# Patient Record
Sex: Male | Born: 1980 | Race: White | Hispanic: No | Marital: Married | Smoking: Never smoker
Health system: Southern US, Community
[De-identification: ages and names within clinical notes are randomized; demographics above are authoritative.]

## PROBLEM LIST (undated history)

## (undated) DIAGNOSIS — G43909 Migraine, unspecified, not intractable, without status migrainosus: Secondary | ICD-10-CM

## (undated) DIAGNOSIS — R55 Syncope and collapse: Secondary | ICD-10-CM

## (undated) DIAGNOSIS — R9430 Abnormal result of cardiovascular function study, unspecified: Secondary | ICD-10-CM

## (undated) DIAGNOSIS — R011 Cardiac murmur, unspecified: Secondary | ICD-10-CM

## (undated) DIAGNOSIS — S42302A Unspecified fracture of shaft of humerus, left arm, initial encounter for closed fracture: Secondary | ICD-10-CM

## (undated) DIAGNOSIS — R002 Palpitations: Secondary | ICD-10-CM

## (undated) HISTORY — DX: Syncope and collapse: R55

## (undated) HISTORY — DX: Cardiac murmur, unspecified: R01.1

## (undated) HISTORY — DX: Migraine, unspecified, not intractable, without status migrainosus: G43.909

## (undated) HISTORY — DX: Palpitations: R00.2

## (undated) HISTORY — DX: Abnormal result of cardiovascular function study, unspecified: R94.30

## (undated) HISTORY — PX: WISDOM TOOTH EXTRACTION: SHX21

---

## 2012-07-03 ENCOUNTER — Encounter (HOSPITAL_COMMUNITY): Payer: Self-pay | Admitting: Emergency Medicine

## 2012-07-03 ENCOUNTER — Emergency Department (HOSPITAL_COMMUNITY): Payer: BC Managed Care – PPO

## 2012-07-03 ENCOUNTER — Emergency Department (HOSPITAL_COMMUNITY)
Admission: EM | Admit: 2012-07-03 | Discharge: 2012-07-03 | Disposition: A | Discharge status: Home / Self Care | Payer: BC Managed Care – PPO | Attending: Emergency Medicine | Admitting: Emergency Medicine

## 2012-07-03 DIAGNOSIS — R55 Syncope and collapse: Secondary | ICD-10-CM

## 2012-07-03 DIAGNOSIS — S0181XA Laceration without foreign body of other part of head, initial encounter: Secondary | ICD-10-CM

## 2012-07-03 DIAGNOSIS — S0180XA Unspecified open wound of other part of head, initial encounter: Secondary | ICD-10-CM | POA: Insufficient documentation

## 2012-07-03 DIAGNOSIS — S0990XA Unspecified injury of head, initial encounter: Secondary | ICD-10-CM | POA: Insufficient documentation

## 2012-07-03 DIAGNOSIS — Y9389 Activity, other specified: Secondary | ICD-10-CM | POA: Insufficient documentation

## 2012-07-03 DIAGNOSIS — Y9289 Other specified places as the place of occurrence of the external cause: Secondary | ICD-10-CM | POA: Insufficient documentation

## 2012-07-03 DIAGNOSIS — W1809XA Striking against other object with subsequent fall, initial encounter: Secondary | ICD-10-CM | POA: Insufficient documentation

## 2012-07-03 HISTORY — DX: Unspecified fracture of shaft of humerus, left arm, initial encounter for closed fracture: S42.302A

## 2012-07-03 LAB — COMPREHENSIVE METABOLIC PANEL
Albumin: 4.3 g/dL (ref 3.5–5.2)
Alkaline Phosphatase: 53 U/L (ref 39–117)
BUN: 18 mg/dL (ref 6–23)
CO2: 26 mEq/L (ref 19–32)
Chloride: 104 mEq/L (ref 96–112)
Creatinine, Ser: 0.94 mg/dL (ref 0.50–1.35)
GFR calc Af Amer: >90 mL/min (ref >90)
GFR calc non Af Amer: >90 mL/min (ref >90)
Glucose, Bld: 90 mg/dL (ref 70–99)
Potassium: 4.2 mEq/L (ref 3.5–5.1)
Total Bilirubin: 0.8 mg/dL (ref 0.3–1.2)

## 2012-07-03 LAB — CBC WITH DIFFERENTIAL/PLATELET
HCT: 45.7 % (ref 39.0–52.0)
Hemoglobin: 16 g/dL (ref 13.0–17.0)
Lymphocytes Relative: 20 % (ref 12–46)
Lymphs Abs: 1.3 10*3/uL (ref 0.7–4.0)
MCHC: 35 g/dL (ref 30.0–36.0)
Monocytes Absolute: 0.5 10*3/uL (ref 0.1–1.0)
Monocytes Relative: 8 % (ref 3–12)
Neutro Abs: 4.6 10*3/uL (ref 1.7–7.7)
Neutrophils Relative %: 72 % (ref 43–77)
RBC: 5.2 MIL/uL (ref 4.22–5.81)

## 2012-07-03 MED ORDER — HYDROCODONE-ACETAMINOPHEN 5-325 MG PO TABS: 1.0000 | ORAL_TABLET | Freq: Four times a day (QID) | ORAL | Status: DC | PRN

## 2012-07-03 MED ORDER — SODIUM CHLORIDE 0.9 % IV BOLUS (SEPSIS): 1000.0000 mL | Freq: Once | INTRAVENOUS | Status: DC

## 2012-07-03 MED ORDER — OXYCODONE-ACETAMINOPHEN 5-325 MG PO TABS
1.0000 | ORAL_TABLET | Freq: Once | ORAL | Status: AC
  Administered 2012-07-03: 1 via ORAL

## 2012-07-03 MED ORDER — OXYCODONE-ACETAMINOPHEN 5-325 MG PO TABS
2.0000 | ORAL_TABLET | Freq: Once | ORAL | Status: DC
  Filled 2012-07-03: qty 2

## 2012-07-03 MED ORDER — IBUPROFEN 800 MG PO TABS: 800.0000 mg | ORAL_TABLET | Freq: Three times a day (TID) | ORAL | Status: DC

## 2012-07-03 NOTE — ED Notes (Signed)
Med student at bedside for suture placement

## 2012-07-03 NOTE — ED Provider Notes (Signed)
Medical screening examination/treatment/procedure(s) were conducted as a shared visit with non-physician practitioner(s) and myself.  I personally evaluated the patient during the encounter   Shelda Jakes, MD 07/03/12 [phone]

## 2012-07-03 NOTE — ED Notes (Signed)
Patient reports that he was taking a shower this morning and had a syncopal episode with +LOC.  Patient has approximately 1 in. laceration on chin with dressing.  Patient denies any h/o or associated symptoms.

## 2012-07-03 NOTE — ED Notes (Signed)
Patient transported to X-ray 

## 2012-07-03 NOTE — ED Provider Notes (Signed)
History     CSN: [id number]  Arrival date & time 07/03/12  4098   First MD Initiated Contact with Patient 07/03/12 0930      Chief Complaint  Patient presents with  . Loss of Consciousness    (Consider location/radiation/quality/duration/timing/severity/associated sxs/prior treatment) The history is provided by the patient.   32 year old male status post syncopal episode in the shower fell and struck his chin left-sided the 23 cm chin laceration. Patient's last tetanus shot was sometime in within the last 10 years. Patient felt lightheaded right before he went out otherwise felt fine. Now feels back to normal when he first woke up he had some tingling and numbness in both arms. No history of syncope in the past. Patient did chip a tooth yesterday and has had a strange sensation related to that this may of contributed. Currently complaining of some mild head discomfort may be related to the injury there was no headache before hand. Some mild neck pain. No other specific or significant complaints.  Past Medical History  Diagnosis Date  . Arm fracture, left     Past Surgical History  Procedure Date  . Wisdom tooth extraction     History reviewed. No pertinent family history.  History  Substance Use Topics  . Smoking status: Never Smoker   . Smokeless tobacco: Not on file  . Alcohol Use: Yes     Comment: socially      Review of Systems  Constitutional: Negative for fever and fatigue.  HENT: Positive for neck pain. Negative for congestion.   Eyes: Negative for visual disturbance.  Respiratory: Negative for shortness of breath.   Cardiovascular: Negative for chest pain.  Gastrointestinal: Negative for nausea, vomiting and abdominal pain.  Genitourinary: Negative for dysuria and hematuria.  Musculoskeletal: Negative for back pain.  Skin: Positive for wound. Negative for rash.  Neurological: Positive for numbness and headaches.  Hematological: Does not bruise/bleed easily.      Allergies  Dairy aid  Home Medications   Current Outpatient Rx  Name  Route  Sig  Dispense  Refill  . HYDROCODONE-ACETAMINOPHEN 5-325 MG PO TABS   Oral   Take 1-2 tablets by mouth every 6 (six) hours as needed for pain.   10 tablet   0   . IBUPROFEN 800 MG PO TABS   Oral   Take 1 tablet (800 mg total) by mouth 3 (three) times daily.   21 tablet   0     BP 111/70  Pulse 84  Temp 97.5 F (36.4 C) (Oral)  Resp 16  SpO2 100%  Physical Exam  Nursing note and vitals reviewed. Constitutional: He is oriented to person, place, and time. He appears well-developed and well-nourished. No distress.  HENT:  Head: Normocephalic.  Mouth/Throat: Oropharynx is clear and moist.       Normal except for left shin 2-3 cm laceration. Bleeding controlled.  Eyes: Conjunctivae normal and EOM are normal. Pupils are equal, round, and reactive to light.  Neck: Normal range of motion. Neck supple.  Cardiovascular: Normal rate, normal heart sounds and intact distal pulses.   No murmur heard. Pulmonary/Chest: Effort normal and breath sounds normal. No respiratory distress.  Abdominal: Soft. Bowel sounds are normal. There is no tenderness.  Musculoskeletal: Normal range of motion. He exhibits no edema and no tenderness.  Neurological: He is alert and oriented to person, place, and time. No cranial nerve deficit. He exhibits normal muscle tone. Coordination normal.  Skin: Skin is warm. No rash  noted.    ED Course  Procedures (including critical care time)   Labs Reviewed  CBC WITH DIFFERENTIAL  COMPREHENSIVE METABOLIC PANEL   Dg Chest 2 View  07/03/2012  *RADIOLOGY REPORT*  Clinical Data: Loss of consciousness  CHEST - 2 VIEW  Comparison: None.  Findings: Lungs are mildly hyperexpanded but clear.  Heart size and pulmonary vascularity are normal.  No adenopathy.  No bone lesions. No pneumothorax.  IMPRESSION: Lungs mildly hyperexpanded but clear.   Original Report Authenticated By: Bretta Bang, M.D.    Ct Head Wo Contrast  07/03/2012  *RADIOLOGY REPORT*  Clinical Data: Loss of consciousness with trauma; bilateral hand numbness  CT HEAD WITHOUT CONTRAST  Technique:  Contiguous axial images were obtained from the base of the skull through the vertex without contrast.  Comparison: None.  Findings:  Ventricles are normal in size and configuration.  There is no mass, hemorrhage, extra-axial fluid collection, or midline shift.  Gray-white compartments are normal.  Bony calvarium appears intact.  The mastoid air cells are clear.  IMPRESSION: Study within normal limits.   Original Report Authenticated By: Bretta Bang, M.D.    Ct Cervical Spine Wo Contrast  07/03/2012  *RADIOLOGY REPORT*  Clinical Data: Trauma with loss of consciousness  CT CERVICAL SPINE WITHOUT CONTRAST  Technique:  Multidetector CT imaging of the cervical spine was performed. Multiplanar CT image reconstructions were also generated.  Comparison: None.  Findings:  There is no fracture or spondylolisthesis.  Prevertebral soft tissues and predental space regions are normal.  Disc spaces appear intact.  There is no appreciable disc extrusion or stenosis.  No extradural defects are appreciated on this study.  Impression:  No appreciable arthropathy.  No fracture or spondylolisthesis.   Original Report Authenticated By: Bretta Bang, M.D.     Date: 07/03/2012  Rate: 64  Rhythm: normal sinus rhythm  QRS Axis: normal  Intervals: normal  ST/T Wave abnormalities: normal  Conduction Disutrbances:none  Narrative Interpretation:   Old EKG Reviewed: none available    1. Syncope, vasovagal   2. Chin laceration       MDM  Patient with single episode in the shower felt lightheaded right before and was not feeling bad earlier or not feeling bad last evening. Patient did chip a tooth yesterday and has had some sort of strange sensation from the tooth that may have contributed. Patient now feels fine. When he fell in  the shower he did sustain a left lower Chin laceration about 2 cm in size. Workup for the syncope was negative cardiac monitoring without arrhythmia head CT negative neck CT negative labs are negative. EKG was normal. Patient had orthostatic blood pressures done in the emergency apartment which were normal. Patient is nontoxic no acute distress can be discharged home sutures can be removed in 5-7 days. Patient will return for any recurrent symptoms. Patient did not require work note he is able to stay at home today and take it easy.  Medical screening examination/treatment/procedure(s) were conducted as a shared visit with non-physician practitioner(s) and myself.  I personally evaluated the patient during the encounter    Suture repair done by mid-level this will be a shared visit provider on procedure note.          Shelda Jakes, MD 07/03/12 [phone]

## 2012-07-03 NOTE — ED Notes (Addendum)
Patient reports syncope with taking a shower, arm numbness, shaking, and paleness.  Patient now reporting lethargy and "feeling groggy".

## 2012-07-03 NOTE — ED Provider Notes (Signed)
LACERATION REPAIR Performed by: Carolee Rota Authorized by: Carolee Rota Consent: Verbal consent obtained. Risks and benefits: risks, benefits and alternatives were discussed Consent given by: patient Patient identity confirmed: provided demographic data Prepped and Draped in normal sterile fashion Wound explored  Laceration Location: L chin  Laceration Length: 2cm  No Foreign Bodies seen or palpated  Anesthesia: local infiltration  Local anesthetic: lidocaine 2% without epinephrine  Anesthetic total: 3 ml  Irrigation method: skin scrub with dermal clenser Amount of cleaning: standard  Skin closure: 5-0 Prolene  Number of sutures: 5  Technique: simple interrupted  Patient tolerance: Patient tolerated the procedure well with no immediate complications.   Christopher Cordova, [place] 07/03/12 1158

## 2013-01-24 ENCOUNTER — Encounter: Payer: Self-pay | Admitting: Cardiology

## 2013-01-24 ENCOUNTER — Ambulatory Visit (INDEPENDENT_AMBULATORY_CARE_PROVIDER_SITE_OTHER): Payer: BC Managed Care – PPO | Admitting: Cardiology

## 2013-01-24 VITALS — BP 106/76 | HR 81 | Ht 72.5 in | Wt 143.8 lb

## 2013-01-24 DIAGNOSIS — R011 Cardiac murmur, unspecified: Secondary | ICD-10-CM | POA: Insufficient documentation

## 2013-01-24 DIAGNOSIS — G43909 Migraine, unspecified, not intractable, without status migrainosus: Secondary | ICD-10-CM | POA: Insufficient documentation

## 2013-01-24 DIAGNOSIS — R0789 Other chest pain: Secondary | ICD-10-CM

## 2013-01-24 DIAGNOSIS — R002 Palpitations: Secondary | ICD-10-CM | POA: Insufficient documentation

## 2013-01-24 DIAGNOSIS — R55 Syncope and collapse: Secondary | ICD-10-CM

## 2013-01-24 NOTE — Assessment & Plan Note (Signed)
At this point there is no evidence of ischemic disease. I think that his chest tightness is nonspecific. At this point I am not recommending any exercise testing. I've encouraged him to go back usual activities.

## 2013-01-24 NOTE — Patient Instructions (Addendum)
**Note De-Identified Latish Toutant Obfuscation** Your physician has requested that you have an echocardiogram. Echocardiography is a painless test that uses sound waves to create images of your heart. It provides your doctor with information about the size and shape of your heart and how well your heart's chambers and valves are working. This procedure takes approximately one hour. There are no restrictions for this procedure. Please schedule within a week from today.  Your physician has recommended that you wear an event monitor. Event monitors are medical devices that record the heart's electrical activity. Doctors most often [place] these monitors to diagnose arrhythmias. Arrhythmias are problems with the speed or rhythm of the heartbeat. The monitor is a small, portable device. You can wear one while you do your normal daily activities. This is usually used to diagnose what is causing palpitations/syncope (passing out). You will wear for 21 days.  Your physician recommends that you schedule a follow-up appointment in: 3 weeks

## 2013-01-24 NOTE — Assessment & Plan Note (Signed)
At this point the patient's syncope sounds like vasovagal syncope. However I do feel that complete evaluation is appropriate. If he has mitral valve prolapse he did have some increased chance of significant arrhythmias. His EKG questions atrial abnormality. Two-dimensional echo be done to assess further. In addition he will wear a 21 day event recorder to be sure that we are not missing any arrhythmias.

## 2013-01-24 NOTE — Progress Notes (Signed)
   HPI  Patient is seen as an add-on today for the complete evaluation of syncope. 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 He has had 2 episodes of syncope. In the past he had an episode in the shower. He had very slight dizziness and then found himself on the floor with a injury to his chin. This required an emergency room visit. The workup revealed no obvious abnormalities. It was felt that he probably had vasovagal syncope. Recently he and his family had a significant GI illness. He had vomiting. With the vomiting he had a syncopal episode. Since that time he said some chest discomfort at rest. He's here for further evaluation.  There is no family history of syncope. There is no family history of sudden cardiac death.  Allergies  Allergen Reactions  . Dairy Aid [Lactase]     Current Outpatient Prescriptions  Medication Sig Dispense Refill  . ibuprofen (ADVIL,MOTRIN) 800 MG tablet Take 800 mg by mouth as needed.       No current facility-administered medications for this visit.    History   Social History  . Marital Status: Married    Spouse Name: N/A    Number of Children: N/A  . Years of Education: N/A   Occupational History  . Not on file.   Social History Main Topics  . Smoking status: Never Smoker   . Smokeless tobacco: Not on file  . Alcohol Use: Yes     Comment: socially  . Drug Use: No  . Sexual Activity: Not Currently   Other Topics Concern  . Not on file   Social History Narrative  . No narrative on file    History reviewed. No pertinent family history.  Past Medical History  Diagnosis Date  . Arm fracture, left   . Syncope   . Palpitation   . Migraines     Past Surgical History  Procedure Laterality Date  . Wisdom tooth extraction      Patient Active Problem List   Diagnosis Date Noted  . Chest tightness 01/24/2013  . Syncope   . Palpitation   . Migraines     ROS   Patient denies fever, chills, headache, sweats,  rash, change in vision, change in hearing, cough, nausea vomiting, urinary symptoms. All other systems are reviewed and are negative.  PHYSICAL EXAM  Patient is here with his wife and his infant child. His wife is a Public affairs consultant. And works at some of the hospitals in town. He is oriented to person time and place. Affect is normal. There is no jugulovenous distention. Lungs are clear. Respiratory effort is nonlabored. Cardiac exam reveals S1 and S2. There is a mitral midsystolic click. There is no murmur. The abdomen is soft. There is no peripheral edema. There are no musculoskeletal deformities. There are no skin rashes.  Filed Vitals:   01/24/13 1603  BP: 106/76  Pulse: 81  Height: 6' 0.5" (1.842 m)  Weight: 143 lb 12.8 oz (65.227 kg)   I have reviewed the outside EKG dated January 23, 2013. There is incomplete right bundle branch block very at there is some very mild nonspecific ST changes. The QT interval is not prolonged. I've also reviewed the EKG from today. There is question of atrial enlargement. ASSESSMENT & PLAN

## 2013-01-24 NOTE — Assessment & Plan Note (Signed)
On physical examination today there is a mitral click. I do not hear murmur. Two-dimensional echo will be done to assess further. In addition there is question of a right atrial abnormality and possibly left atrial abnormality on EKG. This will be further assessed by echo.

## 2013-01-30 ENCOUNTER — Encounter: Payer: Self-pay | Admitting: *Deleted

## 2013-01-30 ENCOUNTER — Ambulatory Visit (HOSPITAL_COMMUNITY): Payer: BC Managed Care – PPO | Attending: Cardiology

## 2013-01-30 ENCOUNTER — Encounter (INDEPENDENT_AMBULATORY_CARE_PROVIDER_SITE_OTHER): Payer: BC Managed Care – PPO

## 2013-01-30 DIAGNOSIS — R011 Cardiac murmur, unspecified: Secondary | ICD-10-CM

## 2013-01-30 DIAGNOSIS — R42 Dizziness and giddiness: Secondary | ICD-10-CM | POA: Insufficient documentation

## 2013-01-30 DIAGNOSIS — R0789 Other chest pain: Secondary | ICD-10-CM | POA: Insufficient documentation

## 2013-01-30 DIAGNOSIS — R55 Syncope and collapse: Secondary | ICD-10-CM

## 2013-01-30 DIAGNOSIS — R002 Palpitations: Secondary | ICD-10-CM | POA: Insufficient documentation

## 2013-01-30 DIAGNOSIS — R9431 Abnormal electrocardiogram [ECG] [EKG]: Secondary | ICD-10-CM | POA: Insufficient documentation

## 2013-01-30 NOTE — Progress Notes (Signed)
Echocardiogram performed.  

## 2013-01-30 NOTE — Progress Notes (Signed)
Patient ID: Christopher Cordova, male   DOB: 03-06-1981, 32 y.o.   MRN: [id number] E-Cardio verite 21 day cardiac event monitor applied to patient.

## 2013-01-31 ENCOUNTER — Encounter: Payer: Self-pay | Admitting: Cardiology

## 2013-01-31 DIAGNOSIS — IMO0002 Reserved for concepts with insufficient information to code with codable children: Secondary | ICD-10-CM | POA: Insufficient documentation

## 2013-01-31 DIAGNOSIS — R9430 Abnormal result of cardiovascular function study, unspecified: Secondary | ICD-10-CM | POA: Insufficient documentation

## 2013-02-18 ENCOUNTER — Ambulatory Visit (INDEPENDENT_AMBULATORY_CARE_PROVIDER_SITE_OTHER): Payer: BC Managed Care – PPO | Admitting: Cardiology

## 2013-02-18 ENCOUNTER — Encounter: Payer: Self-pay | Admitting: Cardiology

## 2013-02-18 VITALS — BP 116/68 | HR 80 | Ht 72.5 in | Wt 148.0 lb

## 2013-02-18 DIAGNOSIS — R9430 Abnormal result of cardiovascular function study, unspecified: Secondary | ICD-10-CM

## 2013-02-18 DIAGNOSIS — R011 Cardiac murmur, unspecified: Secondary | ICD-10-CM

## 2013-02-18 DIAGNOSIS — R0789 Other chest pain: Secondary | ICD-10-CM

## 2013-02-18 DIAGNOSIS — R002 Palpitations: Secondary | ICD-10-CM

## 2013-02-18 DIAGNOSIS — R0989 Other specified symptoms and signs involving the circulatory and respiratory systems: Secondary | ICD-10-CM

## 2013-02-18 DIAGNOSIS — R55 Syncope and collapse: Secondary | ICD-10-CM

## 2013-02-18 NOTE — Patient Instructions (Addendum)
Your physician wants you to follow-up in: 6 months  You will receive a reminder letter in the mail two months in advance. If you don't receive a letter, please call our office to schedule the follow-up appointment.  Your physician recommends that you continue on your current medications as directed. Please refer to the Current Medication list given to you today.  

## 2013-02-18 NOTE — Assessment & Plan Note (Signed)
The patient is not having any significant arrhythmias. His event recorder reveals no significant arrhythmias. No further workup is needed.

## 2013-02-18 NOTE — Assessment & Plan Note (Signed)
His chest tightness is insignificant. He does not need any further workup. He will go back to full activities. I will plan to follow him up on one occasion in 6 months.

## 2013-02-18 NOTE — Assessment & Plan Note (Signed)
The patient definitely has a mitral click on physical exam. There is no murmur. The echo shows slight bowing of the valve. There is no definite prolapse. There is no mitral regurgitation. No further workup is needed. Followup echo in 2-3 years would be appropriate to follow his valve.

## 2013-02-18 NOTE — Progress Notes (Signed)
   HPI  The patient is seen today for followup evaluation of syncope, mild sinus tachycardia, slight chest tightness. I saw him last January 24, 2013. Over time he had had 2 syncopal episodes that sounded vasovagal to me. I decided to proceed with 2-D echo. He has excellent left ventricular function. There is flat closure the mitral valve but no definite prolapse. There was no significant mitral regurgitation. He's been wearing an event recorder. It has shown only sinus rhythm.  TSH was normal at 0.7.  Since his last visit he has not had any significant presyncopal or syncopal symptoms. Also his heart rate has been normal. He had some mild increase in heart rate before I saw him. This was probably related to a viral illness that he was having around that time.  Allergies  Allergen Reactions  . Dairy Aid [Lactase]     Current Outpatient Prescriptions  Medication Sig Dispense Refill  . ibuprofen (ADVIL,MOTRIN) 800 MG tablet Take 800 mg by mouth as needed.       No current facility-administered medications for this visit.    History   Social History  . Marital Status: Married    Spouse Name: N/A    Number of Children: N/A  . Years of Education: N/A   Occupational History  . Not on file.   Social History Main Topics  . Smoking status: Never Smoker   . Smokeless tobacco: Not on file  . Alcohol Use: Yes     Comment: socially  . Drug Use: No  . Sexual Activity: Not Currently   Other Topics Concern  . Not on file   Social History Narrative  . No narrative on file    History reviewed. No pertinent family history.  Past Medical History  Diagnosis Date  . Arm fracture, left   . Syncope   . Palpitation   . Migraines   . Mitral click   . Ejection fraction     .    Past Surgical History  Procedure Laterality Date  . Wisdom tooth extraction      Patient Active Problem List   Diagnosis Date Noted  . Ejection fraction   . Chest tightness 01/24/2013  . Syncope   .  Palpitation   . Migraines   . Mitral click     ROS   Patient denies fever, chills, headache, sweats, rash, change in vision, change in hearing, cough, nausea vomiting, urinary symptoms. All other systems are reviewed and are negative.  PHYSICAL EXAM  Patient is oriented to person time and place. Affect is normal. There is no jugular venous distention. Lungs are clear. Respiratory effort is not labored. Cardiac exam reveals S1 and S2. There is a mitral click heard. There is no murmur. The abdomen is soft. Is no peripheral edema.     Filed Vitals:   02/18/13 1616  BP: 116/68  Pulse: 80  Height: 6' 0.5" (1.842 m)  Weight: 148 lb (67.132 kg)  SpO2: 99%   EKG is done today. I repeated it specifically because the question of very slight abnormalities on the prior EKG. The EKG today is completely normal.  ASSESSMENT & PLAN

## 2013-02-18 NOTE — Assessment & Plan Note (Signed)
Echo shows that his ejection fraction is completely normal.

## 2013-02-18 NOTE — Assessment & Plan Note (Signed)
I believe his syncope was vasovagal. He's been very careful to be sure that he remains hydrated. If he feels poorly he knows to lie down. He will contact me if he has any further problems.

## 2014-08-28 IMAGING — CR DG CHEST 2V
2 series · 2 of 2 positions shown · non-contrast
Comparison: None.

CLINICAL DATA: Loss of consciousness

CHEST - 2 VIEW

[w chest pa]
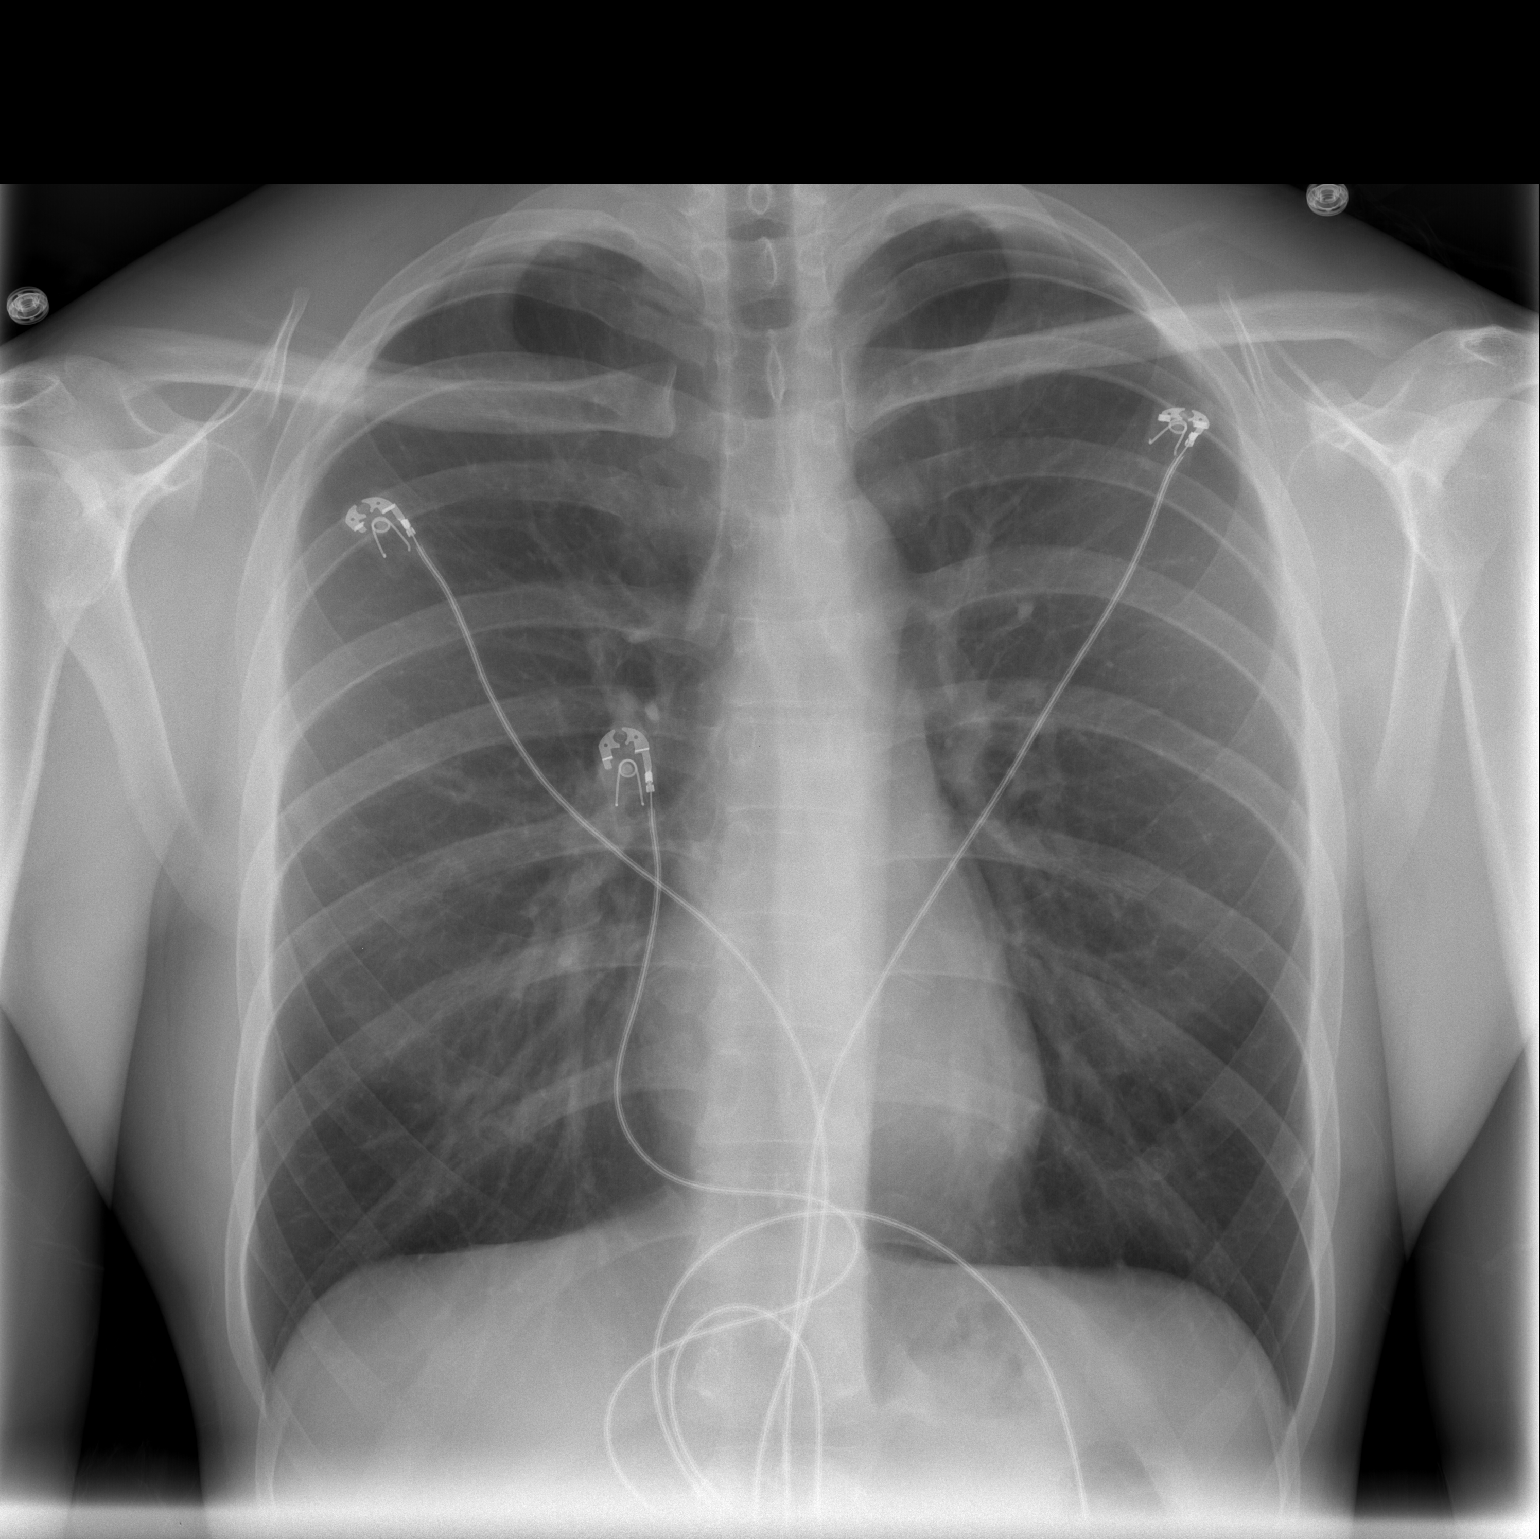

[w chest lat]
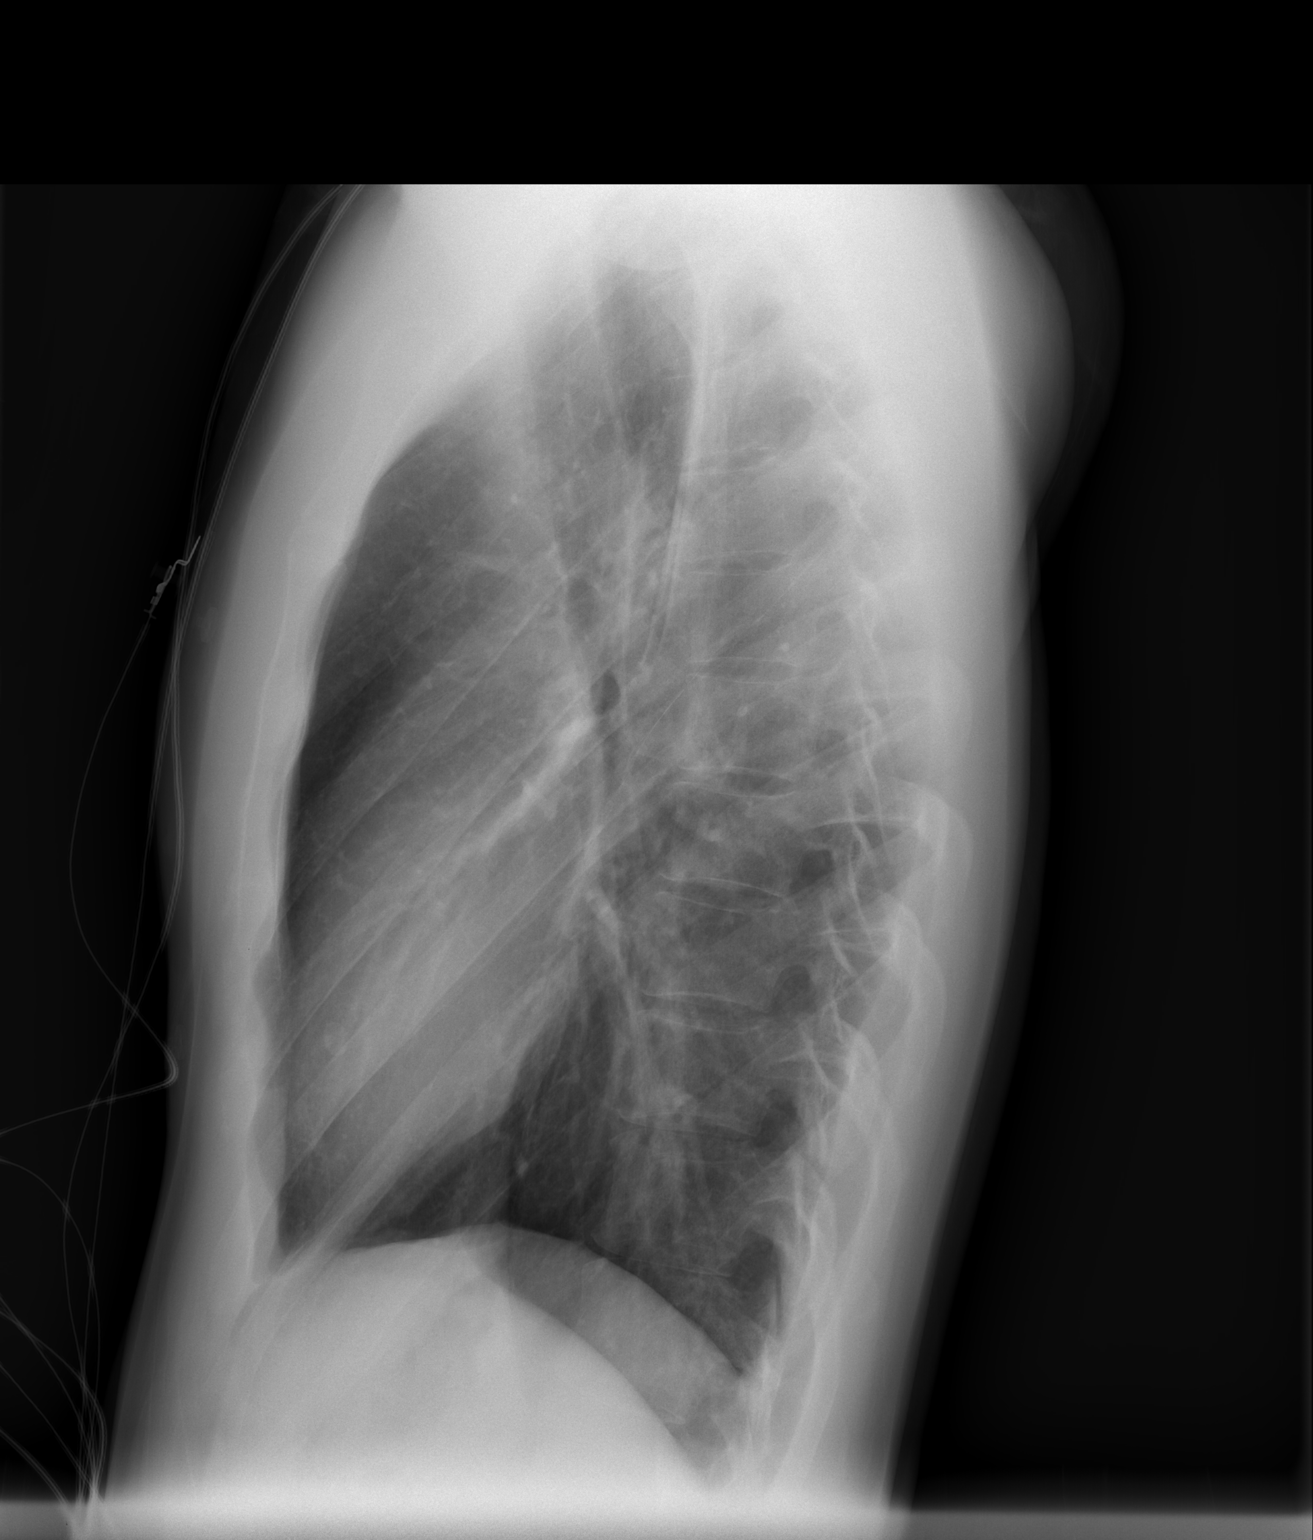

[2 of 2 positions shown; findings below may reference images not displayed]

FINDINGS: Lungs are mildly hyperexpanded but clear.  Heart size and
pulmonary vascularity are normal.  No adenopathy.  No bone lesions.
No pneumothorax.
IMPRESSION: Lungs mildly hyperexpanded but clear.

## 2014-08-28 IMAGING — CT CT HEAD W/O CM
4 of 6 series · 17 of 47 positions shown, 20 images · non-contrast
Comparison: None.

CLINICAL DATA: Loss of consciousness with trauma; bilateral hand
numbness

CT HEAD WITHOUT CONTRAST
TECHNIQUE: Contiguous axial images were obtained from the base of
the skull through the vertex without contrast.

[Series 4: bone windows · axial · 0.43mm/px · z∈[-103,-67]mm · 2 of 49 slices shown]
[im 13/49  bone]
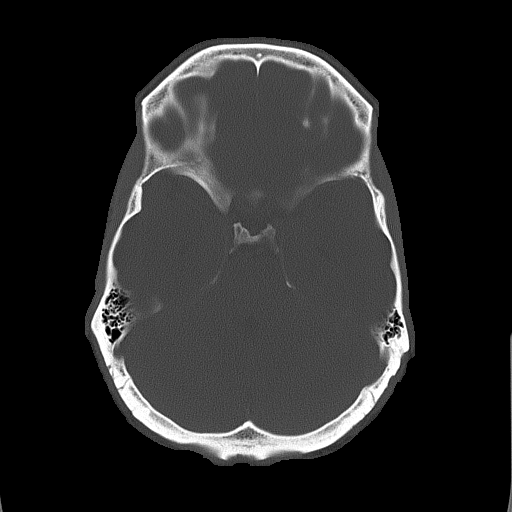
[im 25/49  bone]
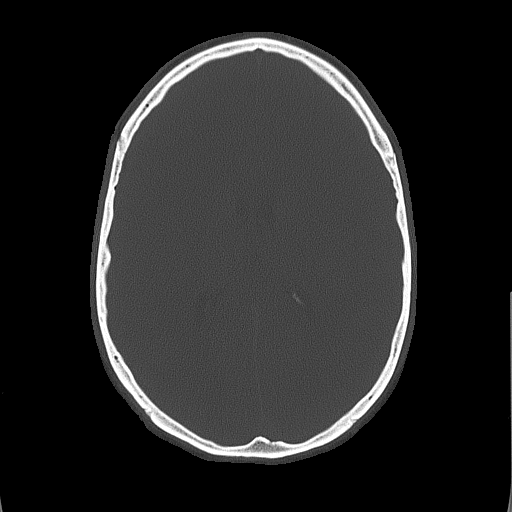

[Series 602: <mpr thick range> · axial · 0.37mm/px · z∈[-295,-145]mm · 9 of 96 slices shown, 12 images]
[im 10/96  brain]
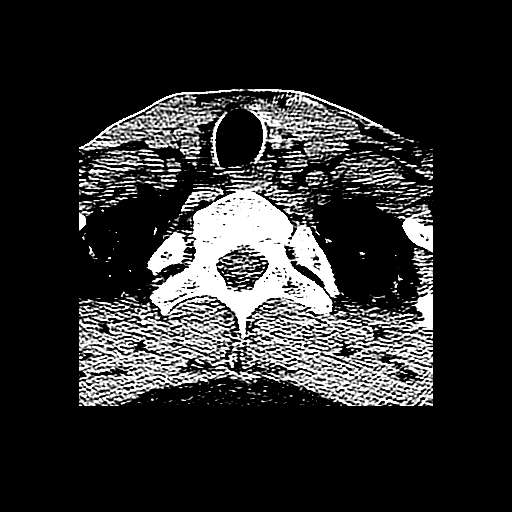
[im 10/96  bone]
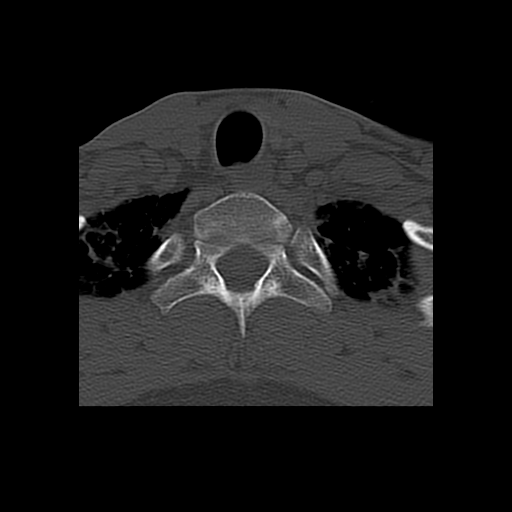
[im 20/96  brain]
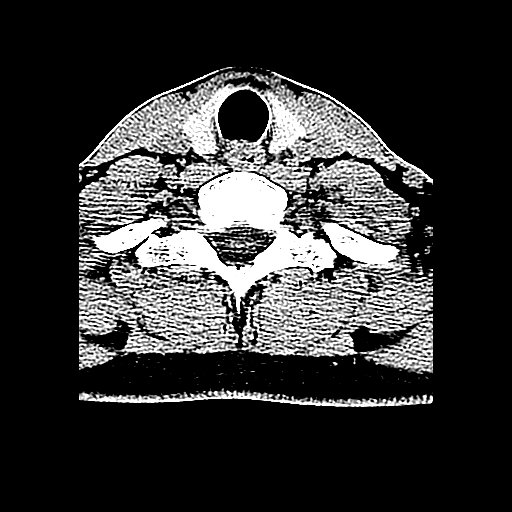
[im 29/96  brain]
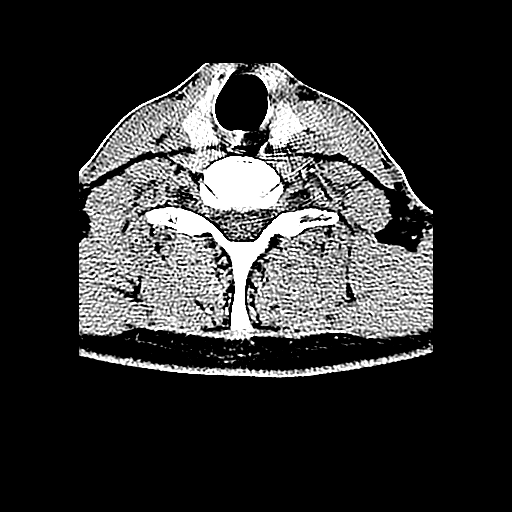
[im 39/96  brain]
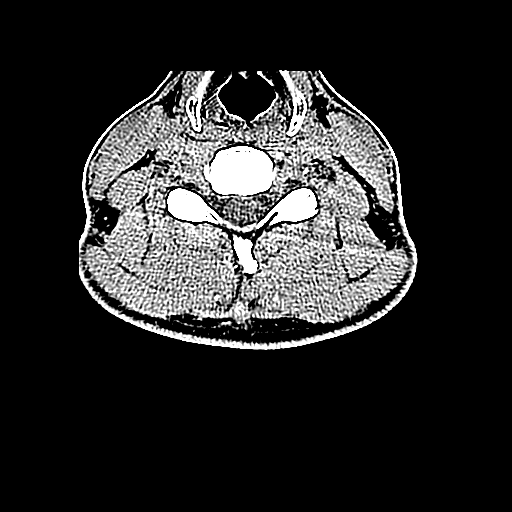
[im 48/96  brain]
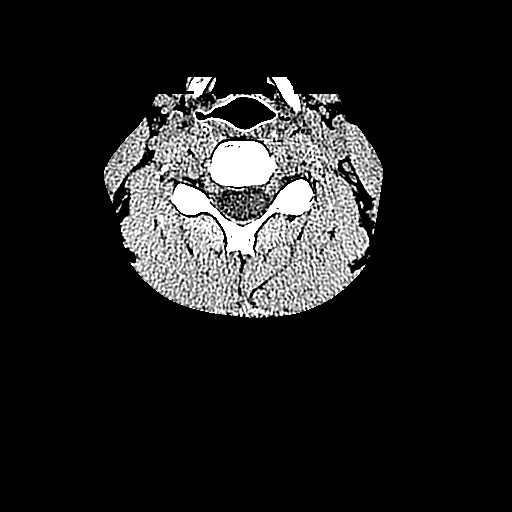
[im 48/96  bone]
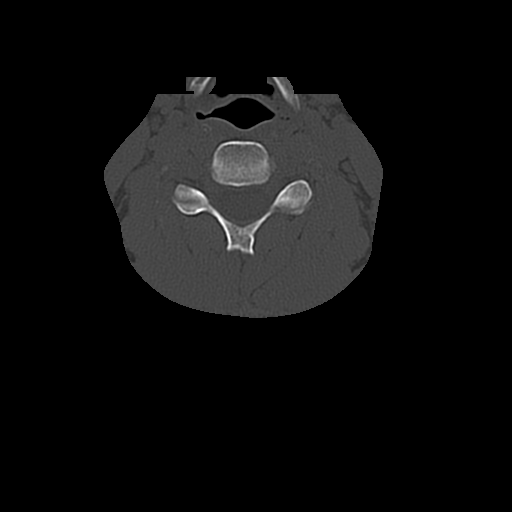
[im 58/96  brain]
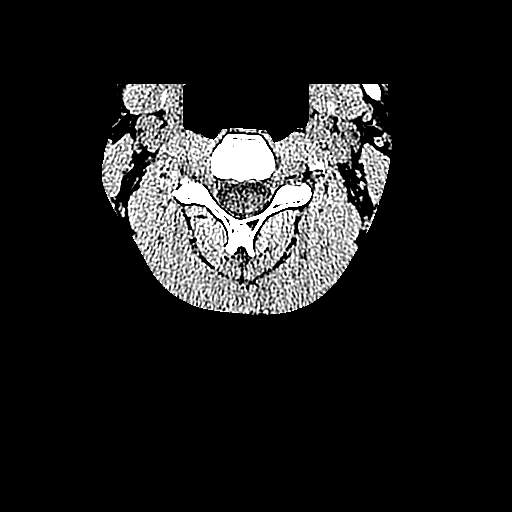
[im 67/96  brain]
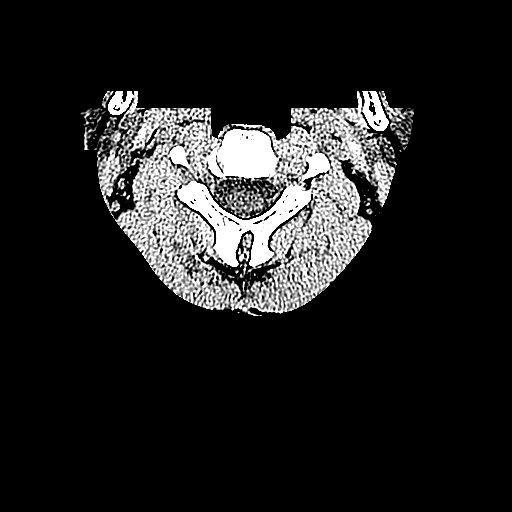
[im 77/96  brain]
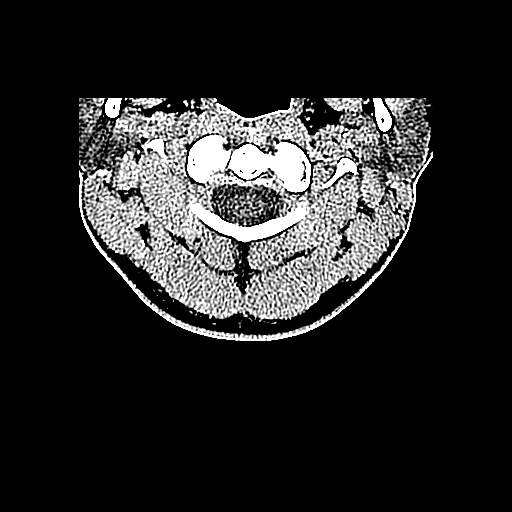
[im 86/96  brain]
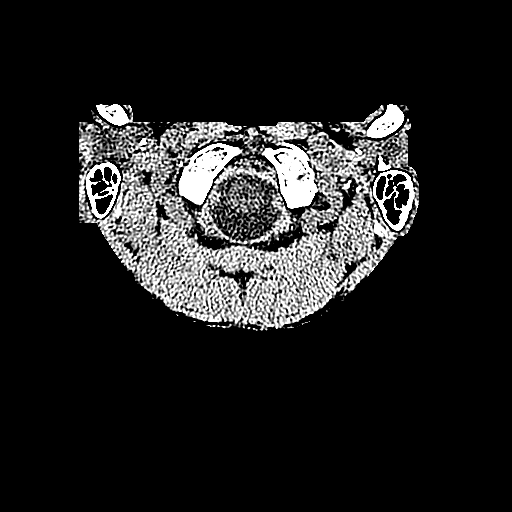
[im 86/96  bone]
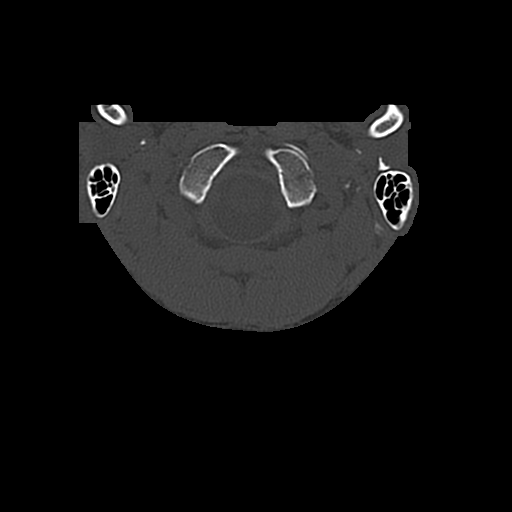

[Series 603: <mpr thick range(1)> · coronal · 0.37mm/px · 3 of 48 slices shown]
[im 16/48  brain]
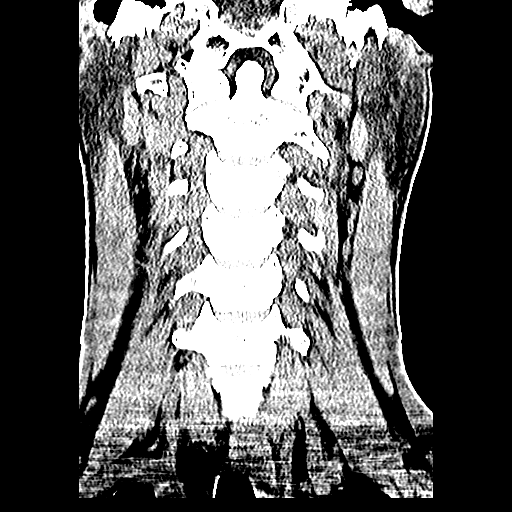
[im 21/48  brain]
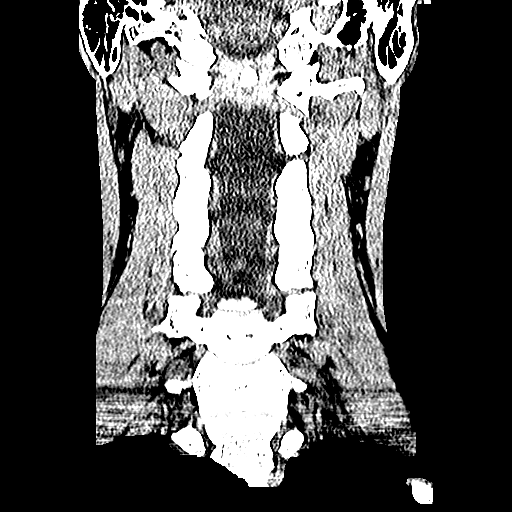
[im 27/48  brain]
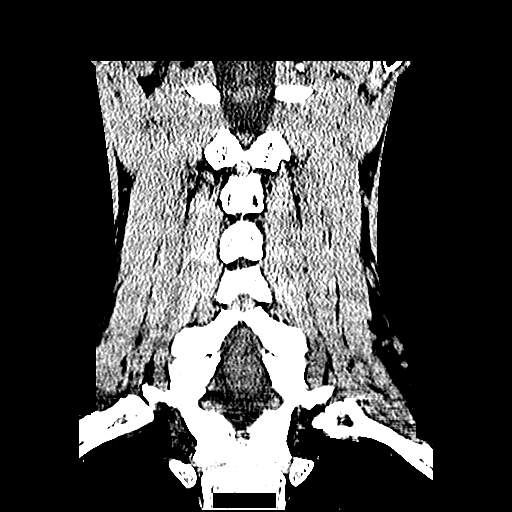

[Series 604: <mpr thick range(2)> · sagittal · 0.37mm/px · 3 of 51 slices shown]
[im 17/51  brain]
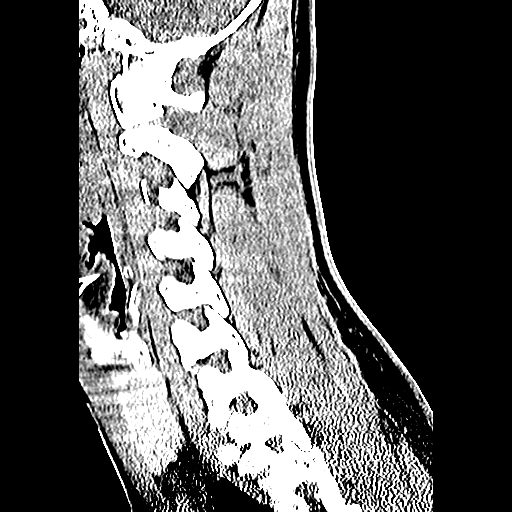
[im 26/51  brain]
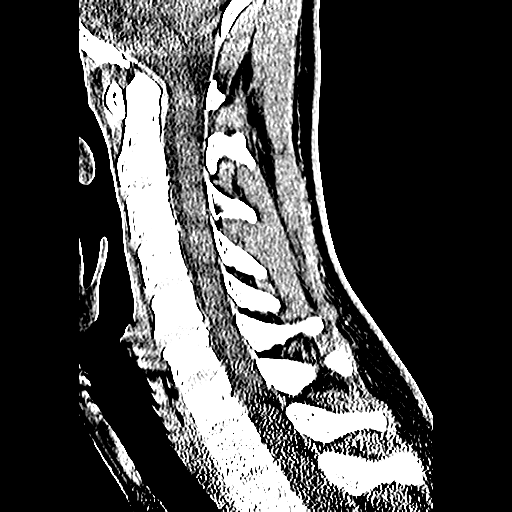
[im 34/51  brain]
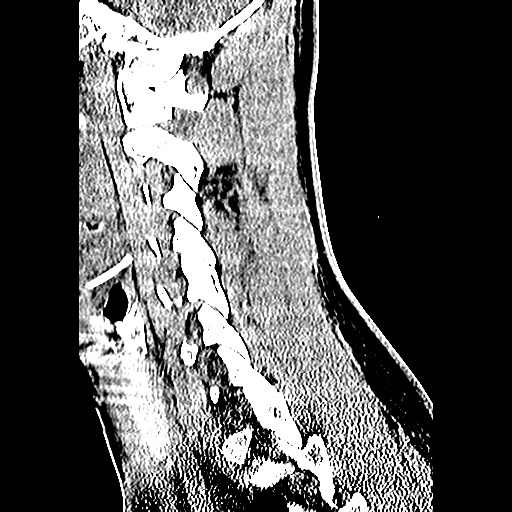

[17 of 47 positions shown; findings below may reference images not displayed]

FINDINGS: Ventricles are normal in size and configuration.  There
is no mass, hemorrhage, extra-axial fluid collection, or midline
shift.  Gray-white compartments are normal.  Bony calvarium appears
intact.  The mastoid air cells are clear.
IMPRESSION: Study within normal limits.

## 2018-05-17 ENCOUNTER — Ambulatory Visit: Payer: Self-pay | Admitting: Nurse Practitioner

## 2018-05-17 VITALS — BP 116/76 | HR 83 | Temp 98.6°F | Resp 20 | Wt 146.6 lb

## 2018-05-17 DIAGNOSIS — J019 Acute sinusitis, unspecified: Secondary | ICD-10-CM

## 2018-05-17 DIAGNOSIS — B9689 Other specified bacterial agents as the cause of diseases classified elsewhere: Secondary | ICD-10-CM

## 2018-05-17 MED ORDER — FLUTICASONE PROPIONATE 50 MCG/ACT NA SUSP: 2.0000 | Freq: Every day | NASAL | 0 refills | Status: AC

## 2018-05-17 MED ORDER — AMOXICILLIN-POT CLAVULANATE 875-125 MG PO TABS: 1.0000 | ORAL_TABLET | Freq: Two times a day (BID) | ORAL | 0 refills | Status: AC

## 2018-05-17 NOTE — Progress Notes (Signed)
Subjective:    Patient ID: Christopher Cordova, male    DOB: 1981/01/12, 37 y.o.   MRN: [id number]  Patient is a 37 year old male who presents today for complaints of sore throat, congestion, headache, and cough.  The patient states his symptoms started about 2 weeks ago and have gradually worsened.  The patient also complains of bilateral ear pressure/fullness, and some facial pressure.  The patient denies fever, sneezing, seasonal allergies, and does not smoke.  The patient states he has been taking over-the-counter cough medicines, increasing fluids, and using honey for his symptoms with minimal relief.  Sinusitis  This is a new problem. The current episode started 1 to 4 weeks ago. The problem has been gradually worsening since onset. There has been no fever. He is experiencing no pain. Associated symptoms include congestion (with dark green nasal drainage), coughing (cough productive of dark green sputum), headaches, sinus pressure and a sore throat. Pertinent negatives include no chills. Past treatments include oral decongestants. The treatment provided mild relief.   Past Medical History:  Diagnosis Date  . Arm fracture, left   . Ejection fraction    .  Marland Kitchen. Migraines   . Mitral click   . Palpitation   . Syncope      Review of Systems  Constitutional: Positive for fatigue. Negative for chills and fever.  HENT: Positive for congestion (with dark green nasal drainage), postnasal drip, sinus pressure, sinus pain and sore throat.        Bilateral ear pressure/fullness  Eyes: Negative.   Respiratory: Positive for cough (cough productive of dark green sputum). Negative for wheezing and stridor.   Cardiovascular: Negative.   Gastrointestinal: Negative.   Skin: Negative.   Allergic/Immunologic: Negative for environmental allergies.  Neurological: Positive for headaches. Negative for dizziness, facial asymmetry, weakness, light-headedness and numbness.       Objective:   Physical Exam   Constitutional: He is oriented to person, place, and time. He appears well-developed and well-nourished. No distress.  HENT:  Head: Normocephalic.  Mouth/Throat: Oropharynx is clear and moist. No oropharyngeal exudate.  Bilateral middle ear effusion, right TM is opaque, no bulging or erythema, left TM is opaque, no bulging or erythema  Eyes: Pupils are equal, round, and reactive to light. Conjunctivae are normal.  Neck: Normal range of motion. Neck supple. No tracheal deviation present. No thyromegaly present.  Cardiovascular: Normal rate and regular rhythm.  Pulmonary/Chest: Effort normal and breath sounds normal. He has no wheezes.  Abdominal: Soft. Bowel sounds are normal.  Neurological: He is alert and oriented to person, place, and time. No cranial nerve deficit.  Skin: Skin is warm and dry. Capillary refill takes less than 2 seconds.  Psychiatric: He has a normal mood and affect.  Vitals reviewed.     Assessment & Plan:   Exam findings, diagnosis etiology and medication use and indications reviewed with patient. Follow- Up and discharge instructions provided. No emergent/urgent issues found on exam.  Antibiotics were prescribed for this patient due to the severity of his symptoms and the long course of his illness.  This is believed to be a bacterial sinusitis based on the presence of purulent nasal drainage, purulent sputum production, and that symptoms began as a simple URI and have persisted.  Patient education was provided. Patient verbalized understanding of information provided and agrees with plan of care (POC), all questions answered. The patient is advised to call or return to clinic if condition does not see an improvement in symptoms, or to  seek the care of the closest emergency department if condition worsens with the above plan.    1. Acute bacterial sinusitis  - amoxicillin-clavulanate (AUGMENTIN) 875-125 MG tablet; Take 1 tablet by mouth 2 (two) times daily for 10 days.   Dispense: 20 tablet; Refill: 0 - fluticasone (FLONASE) 50 MCG/ACT nasal spray; Place 2 sprays into both nostrils daily for 10 days.  Dispense: 16 g; Refill: 0 -Take medication as prescribed. -Ibuprofen or Tylenol for pain, fever, or general discomfort. -Increase fluids. -Sleep elevated on at least 2 pillows at bedtime to help with cough. -Use a humidifier or vaporizer when at home and during sleep. -May use a teaspoon of honey or over-the-counter cough drops to help with cough. -May use normal saline nasal spray to help with nasal congestion throughout the day. -Follow-up if symptoms do not improve.

## 2018-05-17 NOTE — Patient Instructions (Signed)
Sinusitis, Adult -Take medication as prescribed. -Ibuprofen or Tylenol for pain, fever, or general discomfort. -Increase fluids. -Sleep elevated on at least 2 pillows at bedtime to help with cough. -Use a humidifier or vaporizer when at home and during sleep. -May use a teaspoon of honey or over-the-counter cough drops to help with cough. -May use normal saline nasal spray to help with nasal congestion throughout the day. -Follow-up if symptoms do not improve.  Sinusitis is soreness and inflammation of your sinuses. Sinuses are hollow spaces in the bones around your face. Your sinuses are located:  Around your eyes.  In the middle of your forehead.  Behind your nose.  In your cheekbones.  Your sinuses and nasal passages are lined with a stringy fluid (mucus). Mucus normally drains out of your sinuses. When your nasal tissues become inflamed or swollen, the mucus can become trapped or blocked so air cannot flow through your sinuses. This allows bacteria, viruses, and funguses to grow, which leads to infection. Sinusitis can develop quickly and last for 7?10 days (acute) or for more than 12 weeks (chronic). Sinusitis often develops after a cold. What are the causes? This condition is caused by anything that creates swelling in the sinuses or stops mucus from draining, including:  Allergies.  Asthma.  Bacterial or viral infection.  Abnormally shaped bones between the nasal passages.  Nasal growths that contain mucus (nasal polyps).  Narrow sinus openings.  Pollutants, such as chemicals or irritants in the air.  A foreign object stuck in the nose.  A fungal infection. This is rare.  What increases the risk? The following factors may make you more likely to develop this condition:  Having allergies or asthma.  Having had a recent cold or respiratory tract infection.  Having structural deformities or blockages in your nose or sinuses.  Having a weak immune  system.  Doing a lot of swimming or diving.  Overusing nasal sprays.  Smoking.  What are the signs or symptoms? The main symptoms of this condition are pain and a feeling of pressure around the affected sinuses. Other symptoms include:  Upper toothache.  Earache.  Headache.  Bad breath.  Decreased sense of smell and taste.  A cough that may get worse at night.  Fatigue.  Fever.  Thick drainage from your nose. The drainage is often green and it may contain pus (purulent).  Stuffy nose or congestion.  Postnasal drip. This is when extra mucus collects in the throat or back of the nose.  Swelling and warmth over the affected sinuses.  Sore throat.  Sensitivity to light.  How is this diagnosed? This condition is diagnosed based on symptoms, a medical history, and a physical exam. To find out if your condition is acute or chronic, your health care provider may:  Look in your nose for signs of nasal polyps.  Tap over the affected sinus to check for signs of infection.  View the inside of your sinuses using an imaging device that has a light attached (endoscope).  If your health care provider suspects that you have chronic sinusitis, you may also:  Be tested for allergies.  Have a sample of mucus taken from your nose (nasal culture) and checked for bacteria.  Have a mucus sample examined to see if your sinusitis is related to an allergy.  If your sinusitis does not respond to treatment and it lasts longer than 8 weeks, you may have an MRI or CT scan to check your sinuses. These scans   also help to determine how severe your infection is. In rare cases, a bone biopsy may be done to rule out more serious types of fungal sinus disease. How is this treated? Treatment for sinusitis depends on the cause and whether your condition is chronic or acute. If a virus is causing your sinusitis, your symptoms will go away on their own within 10 days. You may be given medicines to  relieve your symptoms, including:  Topical nasal decongestants. They shrink swollen nasal passages and let mucus drain from your sinuses.  Antihistamines. These drugs block inflammation that is triggered by allergies. This can help to ease swelling in your nose and sinuses.  Topical nasal corticosteroids. These are nasal sprays that ease inflammation and swelling in your nose and sinuses.  Nasal saline washes. These rinses can help to get rid of thick mucus in your nose.  If your condition is caused by bacteria, you will be given an antibiotic medicine. If your condition is caused by a fungus, you will be given an antifungal medicine. Surgery may be needed to correct underlying conditions, such as narrow nasal passages. Surgery may also be needed to remove polyps. Follow these instructions at home: Medicines  Take, use, or apply over-the-counter and prescription medicines only as told by your health care provider. These may include nasal sprays.  If you were prescribed an antibiotic medicine, take it as told by your health care provider. Do not stop taking the antibiotic even if you start to feel better. Hydrate and Humidify  Drink enough water to keep your urine clear or pale yellow. Staying hydrated will help to thin your mucus.  Use a cool mist humidifier to keep the humidity level in your home above 50%.  Inhale steam for 10-15 minutes, 3-4 times a day or as told by your health care provider. You can do this in the bathroom while a hot shower is running.  Limit your exposure to cool or dry air. Rest  Rest as much as possible.  Sleep with your head raised (elevated).  Make sure to get enough sleep each night. General instructions  Apply a warm, moist washcloth to your face 3-4 times a day or as told by your health care provider. This will help with discomfort.  Wash your hands often with soap and water to reduce your exposure to viruses and other germs. If soap and water are  not available, use hand sanitizer.  Do not smoke. Avoid being around people who are smoking (secondhand smoke).  Keep all follow-up visits as told by your health care provider. This is important. Contact a health care provider if:  You have a fever.  Your symptoms get worse.  Your symptoms do not improve within 10 days. Get help right away if:  You have a severe headache.  You have persistent vomiting.  You have pain or swelling around your face or eyes.  You have vision problems.  You develop confusion.  Your neck is stiff.  You have trouble breathing. This information is not intended to replace advice given to you by your health care provider. Make sure you discuss any questions you have with your health care provider. Document Released: 05/30/2005 Document Revised: 01/24/2016 Document Reviewed: 03/25/2015 [employer] Interactive Patient Education  2018 [employer]
